# Patient Record
Sex: Female | Born: 1995 | Race: Black or African American | Hispanic: No | Marital: Single | Smoking: Never smoker
Health system: Southern US, Community
[De-identification: ages and names within clinical notes are randomized; demographics above are authoritative.]

## PROBLEM LIST (undated history)

## (undated) ENCOUNTER — Inpatient Hospital Stay (HOSPITAL_COMMUNITY): Payer: Self-pay

## (undated) DIAGNOSIS — Z8619 Personal history of other infectious and parasitic diseases: Secondary | ICD-10-CM

## (undated) DIAGNOSIS — A6009 Herpesviral infection of other urogenital tract: Secondary | ICD-10-CM

## (undated) HISTORY — DX: Personal history of other infectious and parasitic diseases: Z86.19

## (undated) HISTORY — PX: NO PAST SURGERIES: SHX2092

## (undated) HISTORY — DX: Herpesviral infection of other urogenital tract: A60.09

---
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
## 2016-02-01 ENCOUNTER — Ambulatory Visit: Payer: Medicaid Other | Admitting: Family Medicine

## 2016-03-13 NOTE — L&D Delivery Note (Signed)
Delivery Note:Dr. Parke SimmersBland, Dr. Rachelle HoraMoss At 6:08 AM a viable female was delivered via Vaginal, Spontaneous (Presentation:direct OA).  APGAR: 9, 9; weight  pending.   Placenta status:intact 3VC:  with no complications: .  Cord pH: n/a  Anesthesia: none   Episiotomy: None Lacerations: None Suture Repair: n/a Est. Blood Loss (mL): 100  Mom to postpartum.  Baby to Couplet care / Skin to Skin.  Marthenia RollingScott Shanesha Bednarz 02/19/2017, 6:20 AM

## 2016-03-16 ENCOUNTER — Telehealth: Payer: Self-pay | Admitting: Clinical

## 2016-03-16 NOTE — Telephone Encounter (Signed)
First attempt to contact patient, per patient's request, through  [facility]erral from [facility] Ernest HaberJasmine Williams, MSW, LCSW at [facility]; HIPPA-compliant message left on voicemail to return call to [place] at [facility] at [phone].

## 2016-04-04 ENCOUNTER — Ambulatory Visit: Payer: Medicaid Other | Admitting: Medical

## 2016-04-04 ENCOUNTER — Encounter: Payer: Self-pay | Admitting: Medical

## 2016-04-11 ENCOUNTER — Encounter (INDEPENDENT_AMBULATORY_CARE_PROVIDER_SITE_OTHER): Payer: Medicaid Other | Admitting: Medical

## 2016-04-11 NOTE — Progress Notes (Signed)
Pt here today for annual.  Pt is not due for annual due to pt being age of 21.  Advised pt that recommendations state that she does not need one until age of 89.   Denies any breast and vaginal issues.  Notified Raynelle FanningJulie, no new recommendations. Notified pt to f/u as needed for GYN related issues.  Pt stated understanding with no further questions.

## 2016-04-18 ENCOUNTER — Ambulatory Visit (INDEPENDENT_AMBULATORY_CARE_PROVIDER_SITE_OTHER): Payer: Medicaid Other | Admitting: Clinical

## 2016-04-18 DIAGNOSIS — F4323 Adjustment disorder with mixed anxiety and depressed mood: Secondary | ICD-10-CM

## 2016-04-18 NOTE — BH Specialist Note (Signed)
Session Start time: 3:40   End Time: 4:40 Total Time:  60 minutes Type of Service: Behavioral Health - Individual/Family Interpreter: No.   Interpreter Name & Language: n/a # [facility]June [facility]  SUBJECTIVE: Sara Lewis is a 21 y.o. female  Pt. was referred by Dr Alysia PennaErvin for:  depression. Pt. reports the following symptoms/concerns: Pt states her primary concerns are that her sleep is being constantly interrupted by mother-in-law calling and visiting, overeating, and worry over relationship problems with FOB. Pt says it helps to be able to talk about her feelings. Duration of problem:  Over two months Severity: moderately severe Previous treatment: none  OBJECTIVE: Mood: Anxious & Affect: Appropriate Risk of harm to self or others: No known risk of harm to self or others Assessments administered: PHQ9: 17/ GAD7: 18  LIFE CONTEXT:  Family & Social: Lives with FOB and 583 month old son; mother-in-law visits daily. 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  Pt may benefit from psychoeducation and brief therapeutic intervention regarding coping with symptoms of depression and anxiety.   PLAN: 1. F/U with behavioral health clinician: Two weeks 2. Behavioral Health meds: none, does not want at this time, will consider at next visit 3. Behavioral recommendations:  -Obtain journal today.  -Write in journal the hours others may not visit home, nor call, nor text. (Consider baby's morning nap time). Decide upon personal boundaries, and write them  down. -Have discussion with FOB and Mother-in-law about need to sleep uninterrupted during baby's nap time -Use sleep app during baby/mom nap time for additional self-coping, to improve sleep -Attend Mom Talk support group, at least one Tuesday in next two weeks, 10am, [facility] -Read educational material regarding coping with symptoms of depression and anxiety 4. Referral: Brief Counseling/Psychotherapy, Publishing rights managerCommunity Resource and Psychoeducation 5. From scale of 1-10, how likely are you to follow plan: 9  Woc-Behavioral Health Clinician  Behavioral Health Clinician  Warmhandoff: no  Depression screen [facility] 04/18/2016 12/15/2015 12/06/2015  Decreased Interest 2 1 1   Down, Depressed, Hopeless 2 1 1   PHQ - 2 Score 4 2 2   Altered sleeping 3 3 3   Tired, decreased energy 2 3 2   Change in appetite 3 2 2   Feeling bad or failure about yourself  3 2 2   Trouble concentrating 1 2 0  Moving slowly or fidgety/restless 1 1 1   Suicidal thoughts 0 0 0  PHQ-9 Score 17 15 12    GAD 7 : Generalized Anxiety Score 04/18/2016 12/15/2015 12/06/2015  Nervous, Anxious, on Edge 1 1 1   Control/stop worrying 3 2 1   Worry too much - different things 3 2 1   Trouble relaxing 3 2 2   Restless 2 2 1   Easily annoyed or irritable 3 3 3   Afraid - awful might happen 3 2 2   Total GAD 7 Score 18 14 11

## 2016-04-27 ENCOUNTER — Inpatient Hospital Stay (HOSPITAL_COMMUNITY): Payer: Medicaid Other

## 2016-04-27 ENCOUNTER — Inpatient Hospital Stay (HOSPITAL_COMMUNITY)
Admission: AD | Admit: 2016-04-27 | Discharge: 2016-04-27 | Disposition: A | Discharge status: Home / Self Care | Payer: Medicaid Other | Source: Ambulatory Visit | Attending: Emergency Medicine | Admitting: Emergency Medicine

## 2016-04-27 DIAGNOSIS — R079 Chest pain, unspecified: Secondary | ICD-10-CM

## 2016-04-27 DIAGNOSIS — R0781 Pleurodynia: Secondary | ICD-10-CM | POA: Diagnosis not present

## 2016-04-27 LAB — CBC WITH DIFFERENTIAL/PLATELET
BASOS ABS: 0 10*3/uL (ref 0.0–0.1)
BASOS PCT: 0 %
EOS ABS: 0.1 10*3/uL (ref 0.0–0.7)
EOS PCT: 1 %
HCT: 40.9 % (ref 36.0–46.0)
Hemoglobin: 13.8 g/dL (ref 12.0–15.0)
LYMPHS PCT: 37 %
Lymphs Abs: 3.4 10*3/uL (ref 0.7–4.0)
MCH: 28.3 pg (ref 26.0–34.0)
MCHC: 33.7 g/dL (ref 30.0–36.0)
MCV: 83.8 fL (ref 78.0–100.0)
MONO ABS: 0.6 10*3/uL (ref 0.1–1.0)
Monocytes Relative: 6 %
Neutro Abs: 5.1 10*3/uL (ref 1.7–7.7)
Neutrophils Relative %: 56 %
PLATELETS: 204 10*3/uL (ref 150–400)
RBC: 4.88 MIL/uL (ref 3.87–5.11)
RDW: 13.2 % (ref 11.5–15.5)
WBC: 9.2 10*3/uL (ref 4.0–10.5)

## 2016-04-27 LAB — COMPREHENSIVE METABOLIC PANEL
ALT: 24 U/L (ref 14–54)
AST: 24 U/L (ref 15–41)
Albumin: 4.1 g/dL (ref 3.5–5.0)
Alkaline Phosphatase: 88 U/L (ref 38–126)
Anion gap: 6 (ref 5–15)
BUN: 11 mg/dL (ref 6–20)
CO2: 29 mmol/L (ref 22–32)
Calcium: 10.4 mg/dL — ABNORMAL HIGH (ref 8.9–10.3)
Chloride: 101 mmol/L (ref 101–111)
Creatinine, Ser: 0.58 mg/dL (ref 0.44–1.00)
GFR calc Af Amer: >60 mL/min (ref >60)
Glucose, Bld: 101 mg/dL — ABNORMAL HIGH (ref 65–99)
Potassium: 4 mmol/L (ref 3.5–5.1)
SODIUM: 136 mmol/L (ref 135–145)
Total Bilirubin: 0.5 mg/dL (ref 0.3–1.2)
Total Protein: 7.2 g/dL (ref 6.5–8.1)

## 2016-04-27 LAB — URINALYSIS, ROUTINE W REFLEX MICROSCOPIC
Bilirubin Urine: NEGATIVE
GLUCOSE, UA: NEGATIVE mg/dL
HGB URINE DIPSTICK: NEGATIVE
Ketones, ur: NEGATIVE mg/dL
LEUKOCYTES UA: NEGATIVE
Nitrite: NEGATIVE
PH: 7 (ref 5.0–8.0)
Protein, ur: NEGATIVE mg/dL
Specific Gravity, Urine: 1.025 (ref 1.005–1.030)

## 2016-04-27 LAB — POCT PREGNANCY, URINE: PREG TEST UR: NEGATIVE

## 2016-04-27 LAB — PROTIME-INR
INR: 0.92
PROTHROMBIN TIME: 12.3 s (ref 11.4–15.2)

## 2016-04-27 LAB — APTT: APTT: 29 s (ref 24–36)

## 2016-04-27 LAB — D-DIMER, QUANTITATIVE (NOT AT ARMC): D DIMER QUANT: 0.33 ug{FEU}/mL (ref 0.00–0.50)

## 2016-04-27 MED ORDER — SODIUM CHLORIDE 0.9 % NICU IV INFUSION SIMPLE
INJECTION | INTRAVENOUS | Status: DC
  Administered 2016-04-27: 50 mL/h via INTRAVENOUS
  Filled 2016-04-27 (×2): qty 500

## 2016-04-27 MED ORDER — IOPAMIDOL (ISOVUE-370) INJECTION 76%
INTRAVENOUS | Status: AC
  Administered 2016-04-27: 100 mL
  Filled 2016-04-27: qty 100

## 2016-04-27 NOTE — ED Notes (Signed)
[facility] pain upon inhalation.

## 2016-04-27 NOTE — MAU Note (Signed)
Pt presents complaining of pain in her chest when she breathes in that started 4 hours ago. Also hurts in neck and head. States she took an ibuprofen but it did not help. LMP 04/11/16

## 2016-04-27 NOTE — Discharge Instructions (Signed)
1. Medications: usual home medications 2. Treatment: rest, drink plenty of fluids,  3. Follow Up: Please followup with your primary doctor in 2-3 days for discussion of your diagnoses and further evaluation after today's visit; if you do not have a primary care doctor use the resource guide provided to find one; Please return to the ER for fever, chills, shortness of breath, worsening chest pain, syncope or other concerns

## 2016-04-27 NOTE — MAU Provider Note (Signed)
History     CSN: [id number]  Arrival date and time: 04/27/16 0115   First Provider Initiated Contact with Patient 04/27/16 0130      Chief Complaint  Patient presents with  . Chest Pain   Chest Pain   This is a new problem. The current episode started yesterday (around 1900). The onset quality is sudden. The problem occurs intermittently (only with inhalation ). The problem has been unchanged. The pain is present in the lateral region. The pain is at a severity of 7/10. The quality of the pain is described as sharp. The pain does not radiate. Associated symptoms include headaches and vomiting (x1 since chest pain started. ). Pertinent negatives include no cough, fever or shortness of breath ("but I feel like I can't take a deep breath"). The pain is aggravated by breathing. She has tried NSAIDs for the symptoms. The treatment provided no relief. Risk factors include oral contraceptive use.  Pertinent negatives for past medical history include no aneurysm, no anxiety/panic attacks, no CHF, no DVT and no hypertension.  Her family medical history is significant for hypertension and stroke.   Past Medical History:  Diagnosis Date  . Herpes genitalis in women   . Hx of chlamydia infection 2017    Past Surgical History:  Procedure Laterality Date  . NO PAST SURGERIES      Family History  Problem Relation Age of Onset  . Hypertension Mother   . Hypertension Father   . Mental illness Father   . Cancer Maternal Grandmother     Social History  Substance Use Topics  . Smoking status: Never Smoker  . Smokeless tobacco: Never Used  . Alcohol use No    Allergies:  Allergies  Allergen Reactions  . Banana Itching    Prescriptions Prior to Admission  Medication Sig Dispense Refill Last Dose  . ibuprofen (ADVIL,MOTRIN) 600 MG tablet Take 1 tablet (600 mg total) by mouth every 6 (six) hours. 30 tablet 0   . norethindrone (CAMILA) 0.35 MG tablet Take 1 tablet (0.35 mg total) by  mouth daily. 1 Package 11   . Prenatal Vit-Fe Fumarate-FA (PRENATAL MULTIVITAMIN) TABS tablet Take 1 tablet by mouth at bedtime.   Past Month at Unknown time  . valACYclovir (VALTREX) 500 MG tablet Take 1 tablet (500 mg total) by mouth 2 (two) times daily. 60 tablet 6 12/24/2015 at Unknown time    Review of Systems  Constitutional: Negative for chills and fever.  Respiratory: Negative for cough and shortness of breath ("but I feel like I can't take a deep breath").   Cardiovascular: Positive for chest pain.  Gastrointestinal: Positive for vomiting (x1 since chest pain started. ).  Neurological: Positive for headaches.   Physical Exam   Blood pressure 135/89, pulse 103, temperature 98.3 F (36.8 C), resp. rate 18, last menstrual period 04/11/2016, SpO2 100 %, unknown if currently breastfeeding.  Physical Exam  Nursing note and vitals reviewed. Constitutional: She is oriented to person, place, and time. She appears well-developed and well-nourished. No distress.  HENT:  Head: Normocephalic.  Cardiovascular: Normal rate and normal heart sounds.   Respiratory: Effort normal and breath sounds normal. No respiratory distress.  GI: Soft. There is no tenderness. There is no rebound.  Neurological: She is alert and oriented to person, place, and time.  Skin: Skin is warm and dry.  Psychiatric: She has a normal mood and affect.    MAU Course  Procedures  MDM 0140: D/W Dr. Lynelle DoctorKnapp, accepts transfer  0211: [facility] on the until 0219: Patient off the unit with [facility] in stable condition.   Assessment and Plan   1. Chest pain, unspecified type    Transfer to [facility] for further evaluation   Tawnya Crook 04/27/2016, 1:30 AM

## 2016-04-27 NOTE — ED Provider Notes (Signed)
[facility]-EMERGENCY DEPT Provider Note   CSN: [id number] Arrival date & time: 04/27/16  0115     History   Chief Complaint Chief Complaint  Patient presents with  . Chest Pain    HPI Sara Lewis is a 21 y.o. female with no major medical problems presents to the Emergency Department complaining of gradual, persistent, progressively worsening left upper chest pain onset 7pm Wednesday. Pain is worse with exertion and breathing.  Patient reports that around midnight the left-sided chest pain radiated into her neck and head. She had no vision changes, weakness, numbness or tingling. Patient is taking an oral contraceptive.  Pt is also breastfeeding (4mos post partum).  Pt denies nausea, vomiting, diaphoresis, syncope, near syncope.  She reports she's had complete resolution of all of her symptoms within the last hour.  She initially presented to MAU and was transferred here to the emergency department via [facility].    The history is provided by the patient, medical records and the spouse. No language interpreter was used.    Past Medical History:  Diagnosis Date  . Herpes genitalis in women   . Hx of chlamydia infection 2017    Patient Active Problem List   Diagnosis Date Noted  . 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 History of marijuana use 11/29/2015  . Low serum vitamin D 11/29/2015    Past Surgical History:  Procedure Laterality Date  . NO PAST SURGERIES      OB History    Gravida Para Term Preterm AB Living   1 1 1     1    SAB TAB Ectopic Multiple Live Births         0 1       Home Medications    Prior to Admission medications   Medication Sig Start Date End Date Taking? Authorizing Provider  norethindrone (CAMILA) 0.35 MG tablet Take 1 tablet (0.35 mg total) by mouth daily. 12/27/15  Yes Montez Morita, CNM  valACYclovir (VALTREX) 500 MG tablet Take 1 tablet (500 mg total) by mouth 2 (two) times daily. 12/06/15   Judeth Horn, NP    Family  History Family History  Problem Relation Age of Onset  . Hypertension Mother   . Hypertension Father   . Mental illness Father   . Cancer Maternal Grandmother     Social History Social History  Substance Use Topics  . Smoking status: Never Smoker  . Smokeless tobacco: Never Used  . Alcohol use No     Allergies   Banana   Review of Systems Review of Systems  Cardiovascular: Positive for chest pain.       DOE  Neurological: Positive for headaches.  All other systems reviewed and are negative.    Physical Exam Updated Vital Signs BP 117/77   Pulse 88   Temp 98.3 F (36.8 C)   Resp 18   LMP 04/11/2016 (Exact Date)   SpO2 100%   Physical Exam  Constitutional: She appears well-developed and well-nourished. No distress.  Awake, alert, nontoxic appearance  HENT:  Head: Normocephalic and atraumatic.  Mouth/Throat: Oropharynx is clear and moist. No oropharyngeal exudate.  Eyes: Conjunctivae are normal. No scleral icterus.  Neck: Normal range of motion. Neck supple.  Cardiovascular: Normal rate, regular rhythm and intact distal pulses.   Pulmonary/Chest: Effort normal and breath sounds normal. No respiratory distress. She has no wheezes.  Equal chest expansion  Abdominal: Soft. Bowel sounds are normal. She exhibits no mass. There is no tenderness.  There is no rebound and no guarding.  Musculoskeletal: Normal range of motion. She exhibits no edema.  Neurological: She is alert.  Speech is clear and goal oriented Moves extremities without ataxia  Skin: Skin is warm and dry. She is not diaphoretic.  Psychiatric: She has a normal mood and affect.  Nursing note and vitals reviewed.    ED Treatments / Results  Labs (all labs ordered are listed, but only abnormal results are displayed) Labs Reviewed  COMPREHENSIVE METABOLIC PANEL - Abnormal; Notable for the following:       Result Value   Glucose, Bld 101 (*)    Calcium 10.4 (*)    All other components within  normal limits  URINALYSIS, ROUTINE W REFLEX MICROSCOPIC  CBC WITH DIFFERENTIAL/PLATELET  D-DIMER, QUANTITATIVE (NOT AT [facility])  PROTIME-INR  APTT  POCT PREGNANCY, URINE   ED ECG REPORT   Date: 04/27/2016  Rate: 89  Rhythm: normal sinus rhythm  QRS Axis: normal  Intervals: normal  ST/T Wave abnormalities: normal  Conduction Disutrbances:none  Narrative Interpretation:   Old EKG Reviewed: none available  I have personally reviewed the EKG tracing and agree with the computerized printout as noted.   Radiology Ct Angio Chest Pe W Or Wo Contrast  Result Date: 04/27/2016 CLINICAL DATA:  Chest pain on breathing started 4 hours ago. Tachycardia and shortness of breath. EXAM: CT ANGIOGRAPHY CHEST WITH CONTRAST TECHNIQUE: Multidetector CT imaging of the chest was performed using the standard protocol during bolus administration of intravenous contrast. Multiplanar CT image reconstructions and MIPs were obtained to evaluate the vascular anatomy. CONTRAST:  100 mL Isovue 370 COMPARISON:  None. FINDINGS: Cardiovascular: Good opacification of the central and segmental pulmonary arteries. No focal filling defects. No evidence of significant pulmonary embolus. Normal heart size. No pericardial effusion. Normal caliber thoracic aorta. No aortic dissection. Great vessel origins are patent. Mediastinum/Nodes: No enlarged mediastinal, hilar, or axillary lymph nodes. Thyroid gland, trachea, and esophagus demonstrate no significant findings. Lungs/Pleura: Lungs are clear. No pleural effusion or pneumothorax. Upper Abdomen: No acute abnormality. Musculoskeletal: No chest wall abnormality. No acute or significant osseous findings. Review of the MIP images confirms the above findings. IMPRESSION: No evidence of significant pulmonary embolus. No evidence of active pulmonary disease. Electronically Signed   By: Burman Nieves M.D.   On: 04/27/2016 05:14    Procedures Procedures (including critical care  time)  Medications Ordered in ED Medications  sodium chloride 0.9 % IV infusion ( Intravenous Stopped 04/27/16 0538)  iopamidol (ISOVUE-370) 76 % injection (100 mLs  Contrast Given 04/27/16 0450)     Initial Impression / Assessment and Plan / ED Course  I have reviewed the triage vital signs and the nursing notes.  Pertinent labs & imaging results that were available during my care of the patient were reviewed by me and considered in my medical decision making (see chart for details).     Presents with pleuritic chest pain, worse with exertion. Labs are reassuring. Troponin is negative. Heart rate around 98. Her d-dimer is negative however patient has not low risk. CT angiogram chest is without evidence of PE. Patient is asymptomatic at this time. She is well appearing, speaking in full sentences and ambulatory without difficulty. Stable for discharge home at this time. She is to return for worsening symptoms, return of symptoms or other concerns.  Final Clinical Impressions(s) / ED Diagnoses   Final diagnoses:  Chest pain, unspecified type  Pleuritic chest pain    New Prescriptions Discharge Medication  List as of 04/27/2016  5:24 AM       Dierdre ForthHannah Hardy Harcum, PA-C 04/27/16 [id number]    Gilda Creasehristopher J Pollina, MD 04/27/16 [phone]

## 2016-12-04 ENCOUNTER — Ambulatory Visit (INDEPENDENT_AMBULATORY_CARE_PROVIDER_SITE_OTHER): Payer: Self-pay | Admitting: General Practice

## 2016-12-04 ENCOUNTER — Encounter: Payer: Self-pay | Admitting: Family Medicine

## 2016-12-04 ENCOUNTER — Ambulatory Visit: Payer: Self-pay

## 2016-12-04 DIAGNOSIS — O09893 Supervision of other high risk pregnancies, third trimester: Secondary | ICD-10-CM

## 2016-12-04 DIAGNOSIS — Z3687 Encounter for antenatal screening for uncertain dates: Secondary | ICD-10-CM

## 2016-12-04 DIAGNOSIS — Z3201 Encounter for pregnancy test, result positive: Secondary | ICD-10-CM | POA: Diagnosis not present

## 2016-12-04 DIAGNOSIS — O3680X0 Pregnancy with inconclusive fetal viability, not applicable or unspecified: Secondary | ICD-10-CM

## 2016-12-04 DIAGNOSIS — O0933 Supervision of pregnancy with insufficient antenatal care, third trimester: Secondary | ICD-10-CM

## 2016-12-04 NOTE — Progress Notes (Signed)
Patient here for upt today. UPT +. Patient reports first positive home test a month ago. Patient states she has been on OCPs since her baby was born a year ago. Patient states she has stopped OCPs since positive test at home. Patient reports taking OCPs every day without missing a dose. Patient states 3 months ago her periods changed to spotting for 3 days. Patient reports morning sickness starting a month ago. States a couple weeks ago she thought she started to feel a baby move. Patient denies taking medication, just PNV.  Diane Day will scan patient for viability/dating.

## 2016-12-04 NOTE — Progress Notes (Signed)
Pt informed that the ultrasound is considered a limited OB ultrasound and is not intended to be a complete ultrasound exam.  Patient also informed that the ultrasound is not being completed with the intent of assessing for fetal or placental anomalies or any pelvic abnormalities.  Explained that the purpose of today's ultrasound is to assess for viability, estimated gestational age.  Patient acknowledges the purpose of the exam and the limitations of the study.    Pt provided more detailed menstrual history to me and states she missed her OCP's on 2/17 and 2/18 due to being out of town. She resumed taking the pills on 05/01/16. She had periods beginning 04/17/16 and 05/15/16 which were normal. She then had a period which began 06/12/16 however that period was not normal. She stopped taking the OCP's once she had a positive home pregnancy test about a month ago. [place] for anatomy scheduled 2/26 @ 1445. New Ob appt w/2hr GTT will be scheduled ASAP. Pt advised that her due date is subject to change based on findings of detailed [place] scheduled on 12/06/16. She voiced understanding.

## 2016-12-04 NOTE — Progress Notes (Signed)
Patient seen and assessed by nursing staff.  Agree with documentation and plan.  

## 2016-12-05 ENCOUNTER — Encounter (HOSPITAL_COMMUNITY): Payer: Self-pay | Admitting: *Deleted

## 2016-12-06 ENCOUNTER — Ambulatory Visit (HOSPITAL_COMMUNITY)
Admission: RE | Admit: 2016-12-06 | Discharge: 2016-12-06 | Disposition: A | Discharge status: Home / Self Care | Payer: Medicaid Other | Source: Ambulatory Visit | Attending: Family Medicine | Admitting: Family Medicine

## 2016-12-06 ENCOUNTER — Other Ambulatory Visit: Payer: Self-pay | Admitting: Family Medicine

## 2016-12-06 ENCOUNTER — Encounter (HOSPITAL_COMMUNITY): Payer: Self-pay

## 2016-12-06 DIAGNOSIS — Z3689 Encounter for other specified antenatal screening: Secondary | ICD-10-CM

## 2016-12-06 DIAGNOSIS — Z3687 Encounter for antenatal screening for uncertain dates: Secondary | ICD-10-CM

## 2016-12-06 DIAGNOSIS — O0933 Supervision of pregnancy with insufficient antenatal care, third trimester: Secondary | ICD-10-CM | POA: Insufficient documentation

## 2016-12-06 DIAGNOSIS — O09893 Supervision of other high risk pregnancies, third trimester: Secondary | ICD-10-CM

## 2016-12-06 DIAGNOSIS — Z3A29 29 weeks gestation of pregnancy: Secondary | ICD-10-CM | POA: Diagnosis not present

## 2016-12-06 DIAGNOSIS — Z3201 Encounter for pregnancy test, result positive: Secondary | ICD-10-CM

## 2016-12-07 ENCOUNTER — Other Ambulatory Visit: Payer: Self-pay

## 2016-12-07 ENCOUNTER — Other Ambulatory Visit (HOSPITAL_COMMUNITY): Payer: Self-pay | Admitting: *Deleted

## 2016-12-21 ENCOUNTER — Other Ambulatory Visit (HOSPITAL_COMMUNITY): Payer: Self-pay | Admitting: *Deleted

## 2016-12-21 ENCOUNTER — Encounter: Payer: Self-pay | Admitting: Student

## 2016-12-21 DIAGNOSIS — IMO0002 Reserved for concepts with insufficient information to code with codable children: Secondary | ICD-10-CM

## 2016-12-21 DIAGNOSIS — Z0489 Encounter for examination and observation for other specified reasons: Secondary | ICD-10-CM

## 2017-01-03 ENCOUNTER — Ambulatory Visit (HOSPITAL_COMMUNITY)
Admission: RE | Admit: 2017-01-03 | Discharge: 2017-01-03 | Disposition: A | Discharge status: Home / Self Care | Payer: Medicaid Other | Source: Ambulatory Visit | Attending: Family Medicine | Admitting: Family Medicine

## 2017-01-03 DIAGNOSIS — Z0489 Encounter for examination and observation for other specified reasons: Secondary | ICD-10-CM

## 2017-01-03 DIAGNOSIS — Z362 Encounter for other antenatal screening follow-up: Secondary | ICD-10-CM | POA: Diagnosis present

## 2017-01-03 DIAGNOSIS — IMO0002 Reserved for concepts with insufficient information to code with codable children: Secondary | ICD-10-CM

## 2017-02-19 ENCOUNTER — Inpatient Hospital Stay (HOSPITAL_COMMUNITY)
Admission: AD | Admit: 2017-02-19 | Discharge: 2017-02-20 | DRG: 807 | Disposition: A | Discharge status: Home / Self Care | Payer: Medicaid Other | Source: Ambulatory Visit | Attending: Family Medicine | Admitting: Family Medicine

## 2017-02-19 ENCOUNTER — Other Ambulatory Visit: Payer: Self-pay

## 2017-02-19 ENCOUNTER — Encounter (HOSPITAL_COMMUNITY): Payer: Self-pay

## 2017-02-19 DIAGNOSIS — Z3483 Encounter for supervision of other normal pregnancy, third trimester: Secondary | ICD-10-CM | POA: Diagnosis present

## 2017-02-19 DIAGNOSIS — A6000 Herpesviral infection of urogenital system, unspecified: Secondary | ICD-10-CM | POA: Diagnosis present

## 2017-02-19 DIAGNOSIS — O9832 Other infections with a predominantly sexual mode of transmission complicating childbirth: Principal | ICD-10-CM | POA: Diagnosis present

## 2017-02-19 DIAGNOSIS — B009 Herpesviral infection, unspecified: Secondary | ICD-10-CM

## 2017-02-19 DIAGNOSIS — Z3A39 39 weeks gestation of pregnancy: Secondary | ICD-10-CM

## 2017-02-19 LAB — TYPE AND SCREEN
ABO/RH(D): O NEG
Antibody Screen: NEGATIVE

## 2017-02-19 LAB — RAPID HIV SCREEN (HIV 1/2 AB+AG)
HIV 1/2 Antibodies: NONREACTIVE
HIV-1 P24 ANTIGEN - HIV24: NONREACTIVE

## 2017-02-19 LAB — CBC
HEMATOCRIT: 37.2 % (ref 36.0–46.0)
HEMOGLOBIN: 12.2 g/dL (ref 12.0–15.0)
MCH: 28.4 pg (ref 26.0–34.0)
MCHC: 32.8 g/dL (ref 30.0–36.0)
MCV: 86.7 fL (ref 78.0–100.0)
Platelets: 181 10*3/uL (ref 150–400)
RBC: 4.29 MIL/uL (ref 3.87–5.11)
RDW: 13.6 % (ref 11.5–15.5)
WBC: 10 10*3/uL (ref 4.0–10.5)

## 2017-02-19 LAB — RAPID URINE DRUG SCREEN, HOSP PERFORMED
AMPHETAMINES: NOT DETECTED
BARBITURATES: NOT DETECTED
BENZODIAZEPINES: NOT DETECTED
Cocaine: NOT DETECTED
Opiates: NOT DETECTED
TETRAHYDROCANNABINOL: POSITIVE — AB

## 2017-02-19 LAB — HEPATITIS B SURFACE ANTIGEN: HEP B S AG: NEGATIVE

## 2017-02-19 LAB — RPR: RPR Ser Ql: NONREACTIVE

## 2017-02-19 LAB — GROUP B STREP BY PCR: GROUP B STREP BY PCR: NEGATIVE

## 2017-02-19 MED ORDER — FLEET ENEMA 7-19 GM/118ML RE ENEM: 1.0000 | ENEMA | RECTAL | Status: DC | PRN

## 2017-02-19 MED ORDER — ACETAMINOPHEN 325 MG PO TABS: 650.0000 mg | ORAL_TABLET | ORAL | Status: DC | PRN

## 2017-02-19 MED ORDER — SENNOSIDES-DOCUSATE SODIUM 8.6-50 MG PO TABS
2.0000 | ORAL_TABLET | ORAL | Status: DC
  Administered 2017-02-20: 2 via ORAL
  Filled 2017-02-19: qty 2

## 2017-02-19 MED ORDER — SOD CITRATE-CITRIC ACID 500-334 MG/5ML PO SOLN: 30.0000 mL | ORAL | Status: DC | PRN

## 2017-02-19 MED ORDER — OXYTOCIN 40 UNITS IN LACTATED RINGERS INFUSION - SIMPLE MED: 2.5000 [IU]/h | INTRAVENOUS | Status: DC

## 2017-02-19 MED ORDER — IBUPROFEN 600 MG PO TABS
600.0000 mg | ORAL_TABLET | Freq: Four times a day (QID) | ORAL | Status: DC
  Administered 2017-02-19 – 2017-02-20 (×6): 600 mg via ORAL
  Filled 2017-02-19 (×6): qty 1

## 2017-02-19 MED ORDER — LACTATED RINGERS IV SOLN: INTRAVENOUS | Status: DC

## 2017-02-19 MED ORDER — LACTATED RINGERS IV SOLN: 500.0000 mL | INTRAVENOUS | Status: DC | PRN

## 2017-02-19 MED ORDER — ZOLPIDEM TARTRATE 5 MG PO TABS: 5.0000 mg | ORAL_TABLET | Freq: Every evening | ORAL | Status: DC | PRN

## 2017-02-19 MED ORDER — OXYCODONE-ACETAMINOPHEN 5-325 MG PO TABS: 2.0000 | ORAL_TABLET | ORAL | Status: DC | PRN

## 2017-02-19 MED ORDER — LIDOCAINE HCL (PF) 1 % IJ SOLN
30.0000 mL | INTRAMUSCULAR | Status: DC | PRN
  Filled 2017-02-19: qty 30

## 2017-02-19 MED ORDER — DIPHENHYDRAMINE HCL 25 MG PO CAPS: 25.0000 mg | ORAL_CAPSULE | Freq: Four times a day (QID) | ORAL | Status: DC | PRN

## 2017-02-19 MED ORDER — ONDANSETRON HCL 4 MG/2ML IJ SOLN: 4.0000 mg | INTRAMUSCULAR | Status: DC | PRN

## 2017-02-19 MED ORDER — PRENATAL MULTIVITAMIN CH
1.0000 | ORAL_TABLET | Freq: Every day | ORAL | Status: DC
  Administered 2017-02-19 – 2017-02-20 (×2): 1 via ORAL
  Filled 2017-02-19 (×2): qty 1

## 2017-02-19 MED ORDER — TETANUS-DIPHTH-ACELL PERTUSSIS 5-2.5-18.5 LF-MCG/0.5 IM SUSP: 0.5000 mL | Freq: Once | INTRAMUSCULAR | Status: DC

## 2017-02-19 MED ORDER — COCONUT OIL OIL: 1.0000 "application " | TOPICAL_OIL | Status: DC | PRN

## 2017-02-19 MED ORDER — WITCH HAZEL-GLYCERIN EX PADS: 1.0000 "application " | MEDICATED_PAD | CUTANEOUS | Status: DC | PRN

## 2017-02-19 MED ORDER — SIMETHICONE 80 MG PO CHEW: 80.0000 mg | CHEWABLE_TABLET | ORAL | Status: DC | PRN

## 2017-02-19 MED ORDER — OXYCODONE-ACETAMINOPHEN 5-325 MG PO TABS: 1.0000 | ORAL_TABLET | ORAL | Status: DC | PRN

## 2017-02-19 MED ORDER — FENTANYL CITRATE (PF) 100 MCG/2ML IJ SOLN
100.0000 ug | Freq: Once | INTRAMUSCULAR | Status: AC
  Administered 2017-02-19: 100 ug via INTRAVENOUS
  Filled 2017-02-19: qty 2

## 2017-02-19 MED ORDER — OXYTOCIN 10 UNIT/ML IJ SOLN
INTRAMUSCULAR | Status: AC
  Filled 2017-02-19: qty 2

## 2017-02-19 MED ORDER — DIBUCAINE 1 % RE OINT: 1.0000 "application " | TOPICAL_OINTMENT | RECTAL | Status: DC | PRN

## 2017-02-19 MED ORDER — BENZOCAINE-MENTHOL 20-0.5 % EX AERO
1.0000 "application " | INHALATION_SPRAY | CUTANEOUS | Status: DC | PRN
  Administered 2017-02-19: 1 via TOPICAL
  Filled 2017-02-19: qty 56

## 2017-02-19 MED ORDER — ONDANSETRON HCL 4 MG/2ML IJ SOLN: 4.0000 mg | Freq: Four times a day (QID) | INTRAMUSCULAR | Status: DC | PRN

## 2017-02-19 MED ORDER — ONDANSETRON HCL 4 MG PO TABS: 4.0000 mg | ORAL_TABLET | ORAL | Status: DC | PRN

## 2017-02-19 MED ORDER — OXYTOCIN 10 UNIT/ML IJ SOLN
INTRAMUSCULAR | Status: AC
  Administered 2017-02-19: 10 [IU]
  Filled 2017-02-19: qty 1

## 2017-02-19 MED ORDER — OXYTOCIN BOLUS FROM INFUSION: 500.0000 mL | Freq: Once | INTRAVENOUS | Status: DC

## 2017-02-19 MED ORDER — LIDOCAINE HCL (PF) 1 % IJ SOLN
INTRAMUSCULAR | Status: AC
  Filled 2017-02-19: qty 30

## 2017-02-19 MED ORDER — ACETAMINOPHEN 325 MG PO TABS
650.0000 mg | ORAL_TABLET | ORAL | Status: DC | PRN
  Administered 2017-02-19: 650 mg via ORAL
  Filled 2017-02-19: qty 2

## 2017-02-19 NOTE — H&P (Signed)
LABOR AND DELIVERY ADMISSION HISTORY AND PHYSICAL NOTE  Sara Lewis is a 21 y.o. female G2P1001 with IUP at 8061w5d by 29wkUS presenting for SOL.  She reports positive fetal movement. She denies leakage of fluid or vaginal bleeding.  [facility], unaware pregnant until >196months Pregnancy complications:  - Hx of herpes lesion during pregnancy, unknown GBS  Past Medical History: Past Medical History:  Diagnosis Date  . Herpes genitalis in women   . Hx of chlamydia infection 2017    Past Surgical History: Past Surgical History:  Procedure Laterality Date  . NO PAST SURGERIES      Obstetrical History: OB History    Gravida Para Term Preterm AB Living   2 1 1     1    SAB TAB Ectopic Multiple Live Births         0 1      Social History: Social History   Socioeconomic History  . Marital status: Single    Spouse name: None  . Number of children: None  . Years of education: None  . Highest education level: None  Social Needs  . Financial resource strain: None  . Food insecurity - worry: None  . Food insecurity - inability: None  . Transportation needs - medical: None  . Transportation needs - non-medical: None  Occupational History  . None  Tobacco Use  . Smoking status: Never Smoker  . Smokeless tobacco: Never Used  Substance and Sexual Activity  . Alcohol use: No  . Drug use: No    Comment: + UDS at beginning of pregnancy  . Sexual activity: Yes    Birth control/protection: None  Other Topics Concern  . None  Social History Narrative  . None    Family History: Family History  Problem Relation Age of Onset  . Hypertension Mother   . Hypertension Father   . Mental illness Father   . Cancer Maternal Grandmother     Allergies: Allergies  Allergen Reactions  . Banana Itching    Medications Prior to Admission  Medication Sig Dispense Refill Last Dose  . norethindrone (CAMILA) 0.35 MG tablet  Take 1 tablet (0.35 mg total) by mouth daily. (Patient not taking: Reported on 12/06/2016) 1 Package 11 Not Taking  . Prenatal Vit-Fe Fumarate-FA (PRENATAL VITAMIN PO) Take by mouth.   Taking  . valACYclovir (VALTREX) 500 MG tablet Take 1 tablet (500 mg total) by mouth 2 (two) times daily. 60 tablet 6 More than a month at Unknown time     Review of Systems  All systems reviewed and negative except as stated in HPI  Physical Exam- Blood pressure 131/81, pulse 86, resp. rate 19, last menstrual period 06/12/2016, SpO2 98 %, unknown if currently breastfeeding. General appearance: alert, cooperative, appears stated age and no distress Lungs: clear to auscultation bilaterally Heart: regular rate and rhythm Abdomen: soft, non-tender; bowel sounds normal Extremities: No calf swelling or tenderness Presentation: cephalicOA Fetal monitoring: cat 1 via toco Uterine activity: contration regularly 2-4 min on admission Dilation: 8 Effacement (%): 90 Station: -1 Exam by:: Camelia Enganielle Simpson RN  Prenatal labs: ABO, Rh:   O neg Antibody:   neg Rubella:   unknown RPR:   pending HBsAg:   unknown HIV:   unknown GC/Chlamydia: unknown GBS:   pending 1 hr Glucola: unknown Genetic screening:  Not on file Anatomy US: normal  Prenatal Transfer Tool  Maternal Diabetes: No per mom Genetic Screening: not on file Maternal Ultrasounds/Referrals:  Normal Fetal Ultrasounds or other Referrals:  None Maternal Substance Abuse:  Yes:  Type: Marijuana in past, current UDS pending Significant Maternal Medications:  Meds include: Other: valtrex, no lesions currently Significant Maternal Lab Results: None  Results for orders placed or performed during the hospital encounter of 02/19/17 (from the past 24 hour(s))  CBC   Collection Time: 02/19/17  5:22 AM  Result Value Ref Range   WBC 10.0 4.0 - 10.5 K/uL   RBC 4.29 3.87 - 5.11 MIL/uL   Hemoglobin 12.2 12.0 - 15.0 g/dL   HCT 16.137.2 09.636.0 - 04.546.0 %   MCV 86.7 78.0  - 100.0 fL   MCH 28.4 26.0 - 34.0 pg   MCHC 32.8 30.0 - 36.0 g/dL   RDW 40.913.6 81.111.5 - 91.415.5 %   Platelets 181 150 - 400 K/uL    Patient Active Problem List   Diagnosis Date Noted  . 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 History of marijuana use 11/29/2015  . Low serum vitamin D 11/29/2015    Assessment: Sara Lewis is a 21 y.o. G2P1001 at 3844w5d here for SOL for svd  #Labor: expectent managment #Pain: IV fentanyl #FWB: Cat 1 #ID:  GBS unknown #MOF: breast #MOC:depo #Circ:  Yes, outpatient  Marthenia RollingScott Levada Bowersox 02/19/2017, 5:58 AM

## 2017-02-19 NOTE — MAU Note (Signed)
Pt states water broke at 1900-clear fluid. Reports contractions every 2-3 mins since 1 am. Denies vaginal bleeding. Reports good fetal movement.

## 2017-02-20 ENCOUNTER — Ambulatory Visit: Payer: Self-pay

## 2017-02-20 LAB — RUBELLA SCREEN: Rubella: 1.12 index (ref >0.99)

## 2017-02-20 MED ORDER — VALACYCLOVIR HCL 500 MG PO TABS: 500.0000 mg | ORAL_TABLET | Freq: Two times a day (BID) | ORAL | 6 refills | Status: AC

## 2017-02-20 MED ORDER — MEDROXYPROGESTERONE ACETATE 150 MG/ML IM SUSP
150.0000 mg | Freq: Once | INTRAMUSCULAR | Status: AC
  Administered 2017-02-20: 150 mg via INTRAMUSCULAR
  Filled 2017-02-20: qty 1

## 2017-02-20 MED ORDER — VALACYCLOVIR HCL 1 G PO TABS: 2000.0000 mg | ORAL_TABLET | Freq: Two times a day (BID) | ORAL | 0 refills | Status: AC

## 2017-02-20 MED ORDER — RHO D IMMUNE GLOBULIN 1500 UNIT/2ML IJ SOSY
300.0000 ug | PREFILLED_SYRINGE | Freq: Once | INTRAMUSCULAR | Status: AC
  Administered 2017-02-20: 300 ug via INTRAMUSCULAR
  Filled 2017-02-20: qty 2

## 2017-02-20 NOTE — Lactation Note (Signed)
This note was copied from a baby's chart. Lactation Consultation Note  Patient Name: Sara Lewis Today's Date: 02/20/2017 Reason for consult: Initial assessment   P2, Ex BF 9 months. Mother denies questions or problems.  Encouraged depth and applying ebm for soreness.  Ask for coconut oil if needed. Noted case of formula in the room.  Although baby has not had formula, mother states she will do both. Mom encouraged to feed baby 8-12 times/24 hours and with feeding cues.  Breastfeed first before formula. Mom made aware of O/P services, breastfeeding support groups, community resources, and our phone # for post-discharge questions.     Maternal Data Has patient been taught Hand Expression?: Yes Does the patient have breastfeeding experience prior to this delivery?: Yes  Feeding Feeding Type: Breast Fed Length of feed: 20 min  LATCH Score Latch: Grasps breast easily, tongue down, lips flanged, rhythmical sucking.  Audible Swallowing: Spontaneous and intermittent  Type of Nipple: Everted at rest and after stimulation  Comfort (Breast/Nipple): Soft / non-tender  Hold (Positioning): Assistance needed to correctly position infant at breast and maintain latch.  LATCH Score: 9  Interventions    Lactation Tools Discussed/Used     Consult Status Consult Status: Follow-up Date: 02/21/17 Follow-up type: In-patient    Dahlia ByesBerkelhammer, [facility] 02/20/2017, 7:04 PM
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
## 2017-02-20 NOTE — Discharge Summary (Signed)
OB Discharge Summary     Patient Name: Sara Lewis DOB: [birth year] MRN: [id number]  Date of admission: 02/19/2017 Delivering MD: Rolm BookbinderMOSS, AMBER   Date of discharge: 02/20/2017  Admitting diagnosis: 40wks labor check Intrauterine pregnancy: 2520w5d     Secondary diagnosis:  Active Problems:   SVD (spontaneous vaginal delivery)   Labor abnormal   Normal labor  Additional problems: h/o herpes, limited PNC.      Discharge diagnosis: Term Pregnancy Delivered                                                                                                Post partum procedures:rhogam  Augmentation: NA  Complications: None  Hospital course:  Onset of Labor With Vaginal Delivery     21 y.o. yo [id number] at 820w5d was admitted in Active Labor on 02/19/2017. Patient had an uncomplicated labor course as follows:  Membrane Rupture Time/Date: 6:05 AM ,02/19/2017   Intrapartum Procedures: Episiotomy: None [1]                                         Lacerations:  None [1]  Patient had a delivery of a Viable infant. 02/19/2017  Information for the patient's newborn:  [place]ford [place], Boy Betzaira [[id number]][[id number]]  Delivery Method: Vaginal, Spontaneous(Filed from Delivery Summary)    Pateint had an uncomplicated postpartum course.  She is ambulating, tolerating a regular diet, passing flatus, and urinating well. Patient is discharged home in stable condition on 02/20/17.   Physical exam  Vitals:   02/19/17 0902 02/19/17 1258 02/19/17 1754 02/20/17 0552  BP: 127/75 113/77 115/72 110/67  Pulse: 71 79 72 70  Resp: 18 19 18 14   Temp:   (!) 97.4 F (36.3 C) 98.3 F (36.8 C)  TempSrc:   Oral Oral  SpO2: 99%   99%  Weight:      Height:       General: alert, cooperative and no distress Lochia: appropriate Uterine Fundus: firm Incision: N/A DVT Evaluation: No evidence of DVT seen on physical exam. Labs: Lab Results  Component Value Date   WBC 10.0 02/19/2017   HGB 12.2 02/19/2017   HCT 37.2 02/19/2017   MCV 86.7 02/19/2017   PLT 181 02/19/2017   CMP Latest Ref Rng & Units 04/27/2016  Glucose 65 - 99 mg/dL 283(T101(H)  BUN 6 - 20 mg/dL 11  Creatinine 5.170.44 - 6.161.00 mg/dL 0.730.58  Sodium 710135 - 626145 mmol/L 136  Potassium 3.5 - 5.1 mmol/L 4.0  Chloride 101 - 111 mmol/L 101  CO2 22 - 32 mmol/L 29  Calcium 8.9 - 10.3 mg/dL 10.4(H)  Total Protein 6.5 - 8.1 g/dL 7.2  Total Bilirubin 0.3 - 1.2 mg/dL 0.5  Alkaline Phos 38 - 126 U/L 88  AST 15 - 41 U/L 24  ALT 14 - 54 U/L 24    Discharge instruction: per After Visit Summary and "Baby and Me Booklet".  After visit meds:  Allergies as of 02/20/2017      Reactions  Banana Itching      Medication List    TAKE these medications   PRENATAL VITAMIN PO Take by mouth.   valACYclovir 1000 MG tablet Commonly known as:  VALTREX Take 2 tablets (2,000 mg total) by mouth 2 (two) times daily for 2 days. What changed:  You were already taking a medication with the same name, and this prescription was added. Make sure you understand how and when to take each.   valACYclovir 500 MG tablet Commonly known as:  VALTREX Take 1 tablet (500 mg total) by mouth 2 (two) times daily. What changed:  Another medication with the same name was added. Make sure you understand how and when to take each.       Diet: routine diet  Activity: Advance as tolerated. Pelvic rest for 6 weeks.   Outpatient follow up:6 weeks Follow up Appt:No future appointments. Follow up Visit:No Follow-up on file.  Postpartum contraception: Depo Provera  Newborn Data: Live born female  Birth Weight: 6 lb 13 oz (3090 g) APGAR: 9, 9  Newborn Delivery   Birth date/time:  02/19/2017 06:08:00 Delivery type:  Vaginal, Spontaneous     Baby Feeding: Breast Disposition:home with mother   02/20/2017 Marylene LandKathryn Lorraine Kooistra, CNM

## 2017-02-21 ENCOUNTER — Ambulatory Visit: Payer: Self-pay

## 2017-02-21 LAB — RH IG WORKUP (INCLUDES ABO/RH)
ABO/RH(D): O NEG
FETAL SCREEN: NEGATIVE
Gestational Age(Wks): 39.5
UNIT DIVISION: 0

## 2017-02-21 NOTE — Lactation Note (Signed)
This note was copied from a baby's chart. Lactation Consultation Note  Patient Name: Rayford HalstedBoy Delano Proffit FAOZH'YToday's Date: 02/21/2017 Reason for consult: Follow-up assessment  Follow up visit at 53 hours of age.  Mom reports good feedings and denies pain with latching.  Mom is experienced with older child nursing for 6 months.  Mom reports remembering milk volume increasing and need for more frequent feedings.   Discussed milk transitioning to larger volume, engorgement care discussed.  Encouraged frequent feedings. Mom to soften breast as needed prior to latch.   Mom mentioned baby favors right breast more than left.  Lc encouraged mom to keep trying and explained as somewhat normal.  Mom aware of o/p services as needed.   Mom awaiting discharge now.      Maternal Data    Feeding Length of feed: 5 min  LATCH Score Latch: Grasps breast easily, tongue down, lips flanged, rhythmical sucking.  Audible Swallowing: Spontaneous and intermittent  Type of Nipple: Everted at rest and after stimulation  Comfort (Breast/Nipple): Soft / non-tender  Hold (Positioning): No assistance needed to correctly position infant at breast.  LATCH Score: 10  Interventions Interventions: Breast feeding basics reviewed  Lactation Tools Discussed/Used     Consult Status Consult Status: Complete    Franz DellJana Shoptaw 02/21/2017, 11:22 AM

## 2017-03-05 ENCOUNTER — Inpatient Hospital Stay (HOSPITAL_COMMUNITY)
Admission: AD | Admit: 2017-03-05 | Discharge: 2017-03-06 | Disposition: A | Discharge status: Home / Self Care | Payer: Medicaid Other | Source: Ambulatory Visit | Attending: Obstetrics and Gynecology | Admitting: Obstetrics and Gynecology

## 2017-03-05 ENCOUNTER — Encounter (HOSPITAL_COMMUNITY): Payer: Self-pay | Admitting: *Deleted

## 2017-03-05 DIAGNOSIS — R252 Cramp and spasm: Secondary | ICD-10-CM

## 2017-03-05 DIAGNOSIS — N939 Abnormal uterine and vaginal bleeding, unspecified: Secondary | ICD-10-CM

## 2017-03-05 DIAGNOSIS — R1030 Lower abdominal pain, unspecified: Secondary | ICD-10-CM | POA: Insufficient documentation

## 2017-03-05 DIAGNOSIS — O9089 Other complications of the puerperium, not elsewhere classified: Secondary | ICD-10-CM

## 2017-03-05 DIAGNOSIS — Z8619 Personal history of other infectious and parasitic diseases: Secondary | ICD-10-CM | POA: Insufficient documentation
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
## 2017-03-05 NOTE — Discharge Instructions (Signed)
Postpartum Care After Vaginal Delivery °The period of time right after you deliver your newborn is called the postpartum period. °What kind of medical care will I receive? °· You may continue to receive fluids and medicines through an IV tube inserted into one of your veins. °· If an incision was made near your vagina (episiotomy) or if you had some vaginal tearing during delivery, cold compresses may be placed on your episiotomy or your tear. This helps to reduce pain and swelling. °· You may be given a squirt bottle to use when you go to the bathroom. You may use this until you are comfortable wiping as usual. To use the squirt bottle, follow these steps: °? Before you urinate, fill the squirt bottle with warm water. Do not use hot water. °? After you urinate, while you are sitting on the toilet, use the squirt bottle to rinse the area around your urethra and vaginal opening. This rinses away any urine and blood. °? You may do this instead of wiping. As you start healing, you may use the squirt bottle before wiping yourself. Make sure to wipe gently. °? Fill the squirt bottle with clean water every time you use the bathroom. °· You will be given sanitary pads to wear. °How can I expect to feel? °· You may not feel the need to urinate for several hours after delivery. °· You will have some soreness and pain in your abdomen and vagina. °· If you are breastfeeding, you may have uterine contractions every time you breastfeed for up to several weeks postpartum. Uterine contractions help your uterus return to its normal size. °· It is normal to have vaginal bleeding (lochia) after delivery. The amount and appearance of lochia is often similar to a menstrual period in the first week after delivery. It will gradually decrease over the next few weeks to a dry, yellow-brown discharge. For most women, lochia stops completely by 6-8 weeks after delivery. Vaginal bleeding can vary from woman to woman. °· Within the first few  days after delivery, you may have breast engorgement. This is when your breasts feel heavy, full, and uncomfortable. Your breasts may also throb and feel hard, tightly stretched, warm, and tender. After this occurs, you may have milk leaking from your breasts. Your health care provider can help you relieve discomfort due to breast engorgement. Breast engorgement should go away within a few days. °· You may feel more sad or worried than normal due to hormonal changes after delivery. These feelings should not last more than a few days. If these feelings do not go away after several days, speak with your health care provider. °How should I care for myself? °· Tell your health care provider if you have pain or discomfort. °· Drink enough water to keep your urine clear or pale yellow. °· Wash your hands thoroughly with soap and water for at least 20 seconds after changing your sanitary pads, after using the toilet, and before holding or feeding your baby. °· If you are not breastfeeding, avoid touching your breasts a lot. Doing this can make your breasts produce more milk. °· If you become weak or lightheaded, or you feel like you might faint, ask for help before: °? Getting out of bed. °? Showering. °· Change your sanitary pads frequently. Watch for any changes in your flow, such as a sudden increase in volume, a change in color, the passing of large blood clots. If you pass a blood clot from your vagina, save it   to show to your health care provider. Do not flush blood clots down the toilet without having your health care provider look at them. °· Make sure that all your vaccinations are up to date. This can help protect you and your baby from getting certain diseases. You may need to have immunizations done before you leave the hospital. °· If desired, talk with your health care provider about methods of family planning or birth control (contraception). °How can I start bonding with my baby? °Spending as much time as  possible with your baby is very important. During this time, you and your baby can get to know each other and develop a bond. Having your baby stay with you in your room (rooming in) can give you time to get to know your baby. Rooming in can also help you become comfortable caring for your baby. Breastfeeding can also help you bond with your baby. °How can I plan for returning home with my baby? °· Make sure that you have a car seat installed in your vehicle. °? Your car seat should be checked by a certified car seat installer to make sure that it is installed safely. °? Make sure that your baby fits into the car seat safely. °· Ask your health care provider any questions you have about caring for yourself or your baby. Make sure that you are able to contact your health care provider with any questions after leaving the hospital. °This information is not intended to replace advice given to you by your health care provider. Make sure you discuss any questions you have with your health care provider. °Document Released: 12/25/2006 Document Revised: 08/02/2015 Document Reviewed: 02/01/2015 °Elsevier Interactive Patient Education © 2018 [employer] ° °

## 2017-03-22 ENCOUNTER — Ambulatory Visit (INDEPENDENT_AMBULATORY_CARE_PROVIDER_SITE_OTHER): Payer: Medicaid Other | Admitting: Student

## 2017-03-22 ENCOUNTER — Ambulatory Visit: Payer: Self-pay | Admitting: Clinical

## 2017-03-22 ENCOUNTER — Encounter: Payer: Self-pay | Admitting: Student

## 2017-03-22 NOTE — BH Specialist Note (Deleted)
Pt declined visit today  [facility] 03/22/2017 04/18/2016 12/15/2015 12/06/2015  Decreased Interest 0 2 1 1   Down, Depressed, Hopeless 0 2 1 1   PHQ - 2 Score 0 4 2 2   Altered sleeping 0 3 3 3   Tired, decreased energy 0 2 3 2   Change in appetite 0 3 2 2   Feeling bad or failure about yourself  0 3 2 2   Trouble concentrating 0 1 2 0  Moving slowly or fidgety/restless 0 1 1 1   Suicidal thoughts 0 0 0 0  PHQ-9 Score 0 17 15 12    GAD 7 : Generalized Anxiety Score 03/22/2017 04/18/2016 12/15/2015 12/06/2015  Nervous, Anxious, on Edge 0 1 1 1   Control/stop worrying 0 3 2 1   Worry too much - different things 0 3 2 1   Trouble relaxing 0 3 2 2   Restless 0 2 2 1   Easily annoyed or irritable 0 3 3 3   Afraid - awful might happen 0 3 2 2   Total GAD 7 Score 0 18 14 11

## 2017-03-22 NOTE — Patient Instructions (Signed)

## 2017-03-22 NOTE — Progress Notes (Addendum)
Subjective:     Sara Lewis is a 22 y.o. female who presents for a postpartum visit. She is 4 weeks postpartum following a spontaneous vaginal delivery. I have fully reviewed the prenatal and intrapartum course. The delivery was at 39 gestational weeks. Outcome: spontaneous vaginal delivery. Anesthesia: none. Postpartum course has been uneventful. Baby's course has been uneventful. Baby is feeding by breast. Bleeding no bleeding. Bowel function is normal. Bladder function is normal. Patient is not sexually active. Contraception method is Depo-Provera injections. Postpartum depression screening: negative.  The following portions of the patient's history were reviewed and updated as appropriate: allergies, current medications, past family history, past medical history, past social history, past surgical history and problem list.  Patient states that she is feeling very good. She does not think she needs to see Jamie-her PP depression was mostly related to her family situation with her son but this pregnancy it has been much better.   Review of Systems Pertinent items are noted in HPI.   Objective:    LMP 06/12/2016 (Approximate)   General:  alert, cooperative and no distress   Breasts:  inspection negative, no nipple discharge or bleeding, no masses or nodularity palpable  Lungs: clear to auscultation bilaterally  Heart:  regular rate and rhythm, S1, S2 normal, no murmur, click, rub or gallop  Abdomen: soft, non-tender; bowel sounds normal; no masses,  no organomegaly   Vulva:  not evaluated  Vagina: not evaluated  Cervix:  not evaluated  Corpus: not examined  Adnexa:  not evaluated  Rectal Exam: Not performed.        Assessment:    Healthy  postpartum exam. Pap smear not done at today's visit.   PP Depression score negative.   Plan:    1. Contraception: Depo-Provera injections. Plan to come back in May 24 2017 for Depo shot.  2. Follow up in: 1 year or as needed. Plan for  pap smear once patient turns 22.

## 2017-03-23 NOTE — BH Specialist Note (Deleted)
Pt declined visit.

## 2017-03-29 NOTE — Progress Notes (Signed)
Error

## 2017-06-07 ENCOUNTER — Ambulatory Visit: Payer: Self-pay

## 2018-01-07 ENCOUNTER — Encounter: Payer: Self-pay | Admitting: *Deleted

## 2018-05-24 IMAGING — CT CT ANGIO CHEST
2 of 6 series · 18 of 36 positions shown · IV contrast (Omni 300)
Comparison: None.

CLINICAL DATA: Chest pain on breathing started 4 hours ago.
Tachycardia and shortness of breath.

EXAM:
CT ANGIOGRAPHY CHEST WITH CONTRAST
TECHNIQUE: Multidetector CT imaging of the chest was performed using the
standard protocol during bolus administration of intravenous
contrast. Multiplanar CT image reconstructions and MIPs were
obtained to evaluate the vascular anatomy.
CONTRAST:  100 mL Isovue 370

[Series 6: pe thins · axial · 0.56mm/px · z∈[+1062,+1261]mm · 17 of 225 slices shown]
[im 13/225  lung]
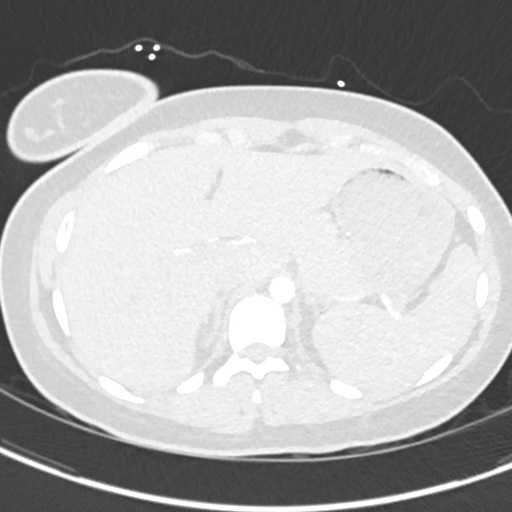
[im 25/225  mediastinal]
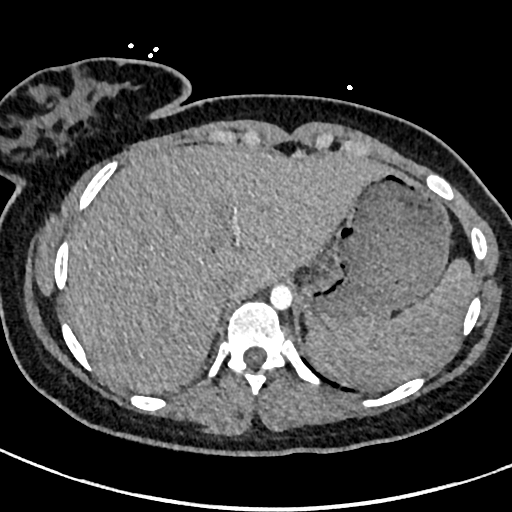
[im 38/225  lung]
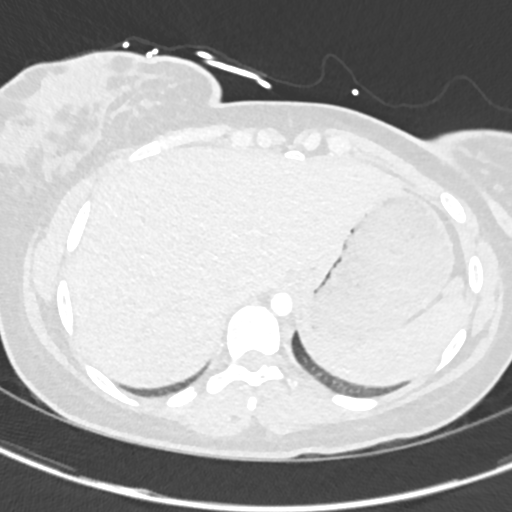
[im 50/225  mediastinal]
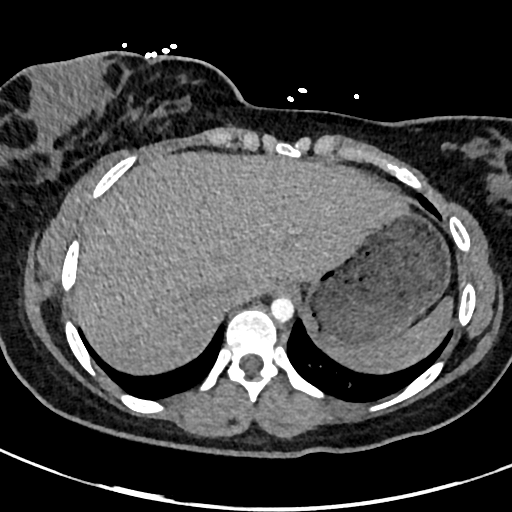
[im 63/225  lung]
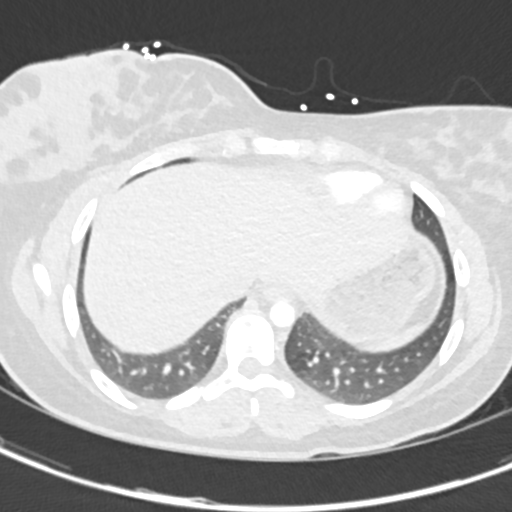
[im 75/225  mediastinal]
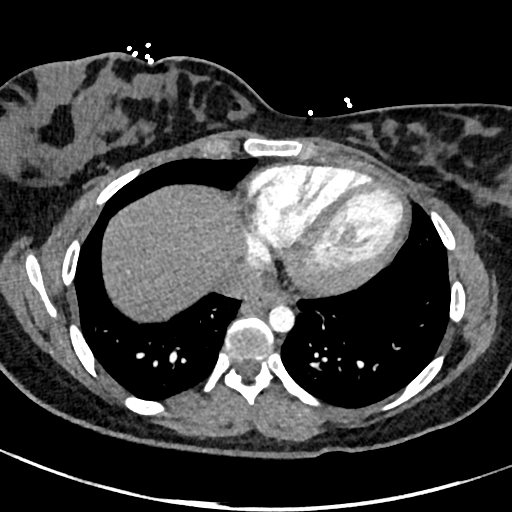
[im 88/225  lung]
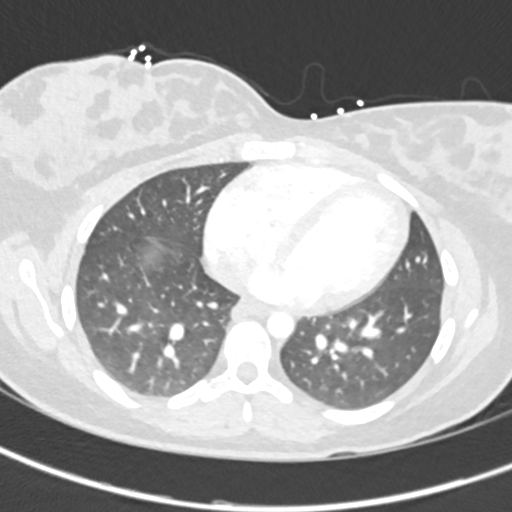
[im 100/225  mediastinal]
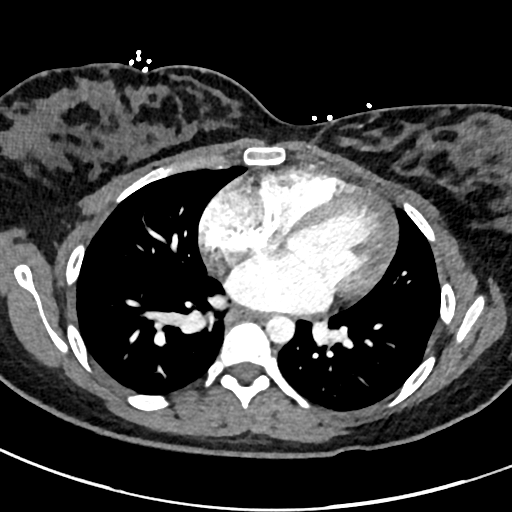
[im 113/225  lung]
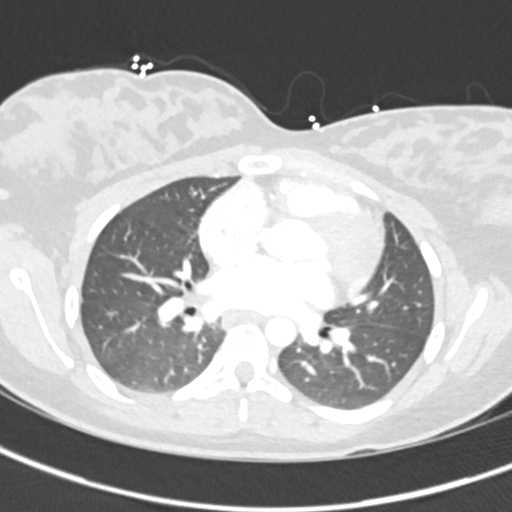
[im 125/225  mediastinal]
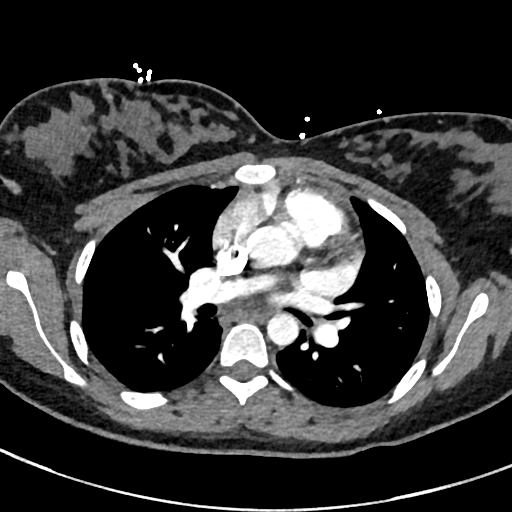
[im 137/225  lung]
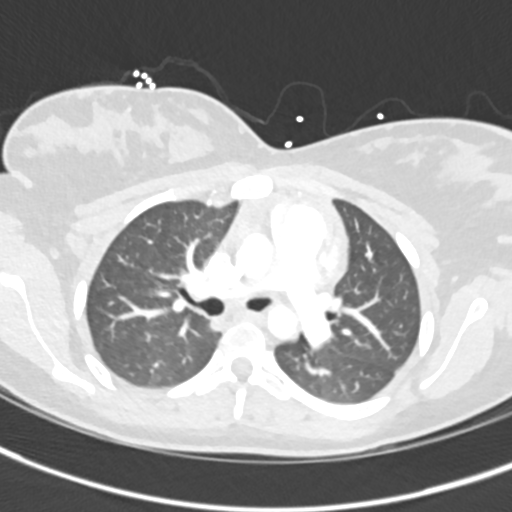
[im 150/225  mediastinal]
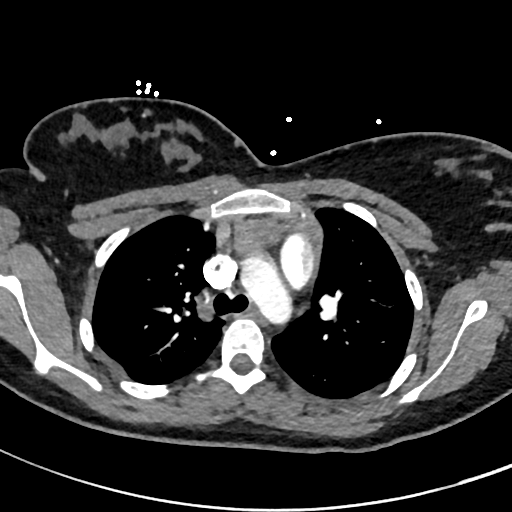
[im 162/225  lung]
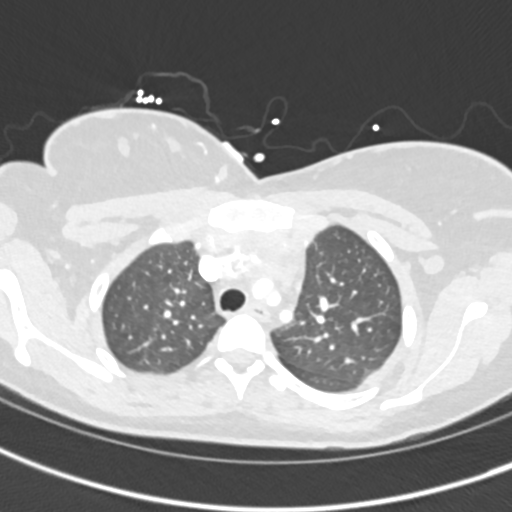
[im 175/225  mediastinal]
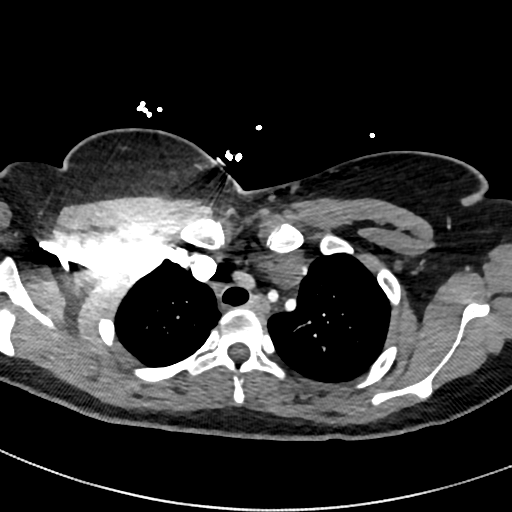
[im 187/225  lung]
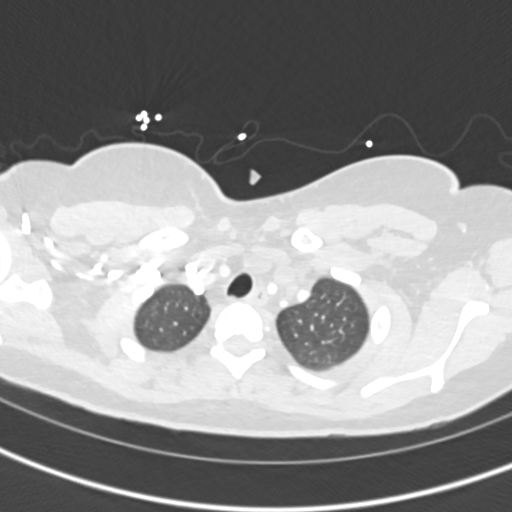
[im 200/225  mediastinal]
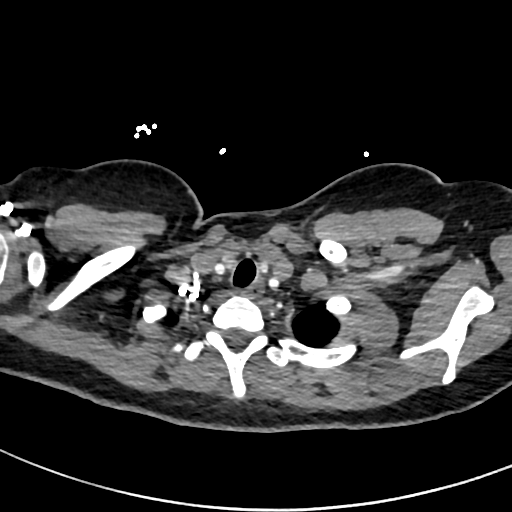
[im 212/225  lung]
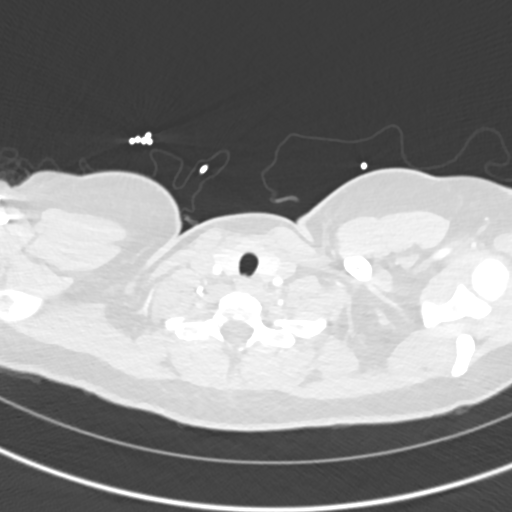

[Series 7: pe 2mm cor · coronal · 0.47mm/px · 1 of 86 slices shown]
[im 43/86  mediastinal]
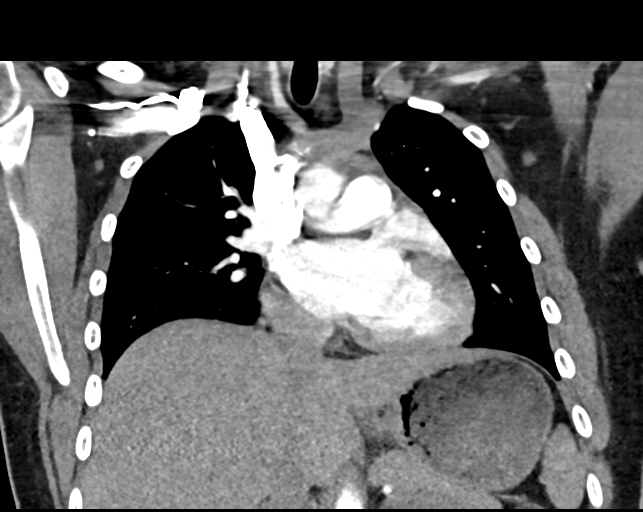

[18 of 36 positions shown; findings below may reference images not displayed]

FINDINGS: Cardiovascular: Good opacification of the central and segmental
pulmonary arteries. No focal filling defects. No evidence of
significant pulmonary embolus. Normal heart size. No pericardial
effusion. Normal caliber thoracic aorta. No aortic dissection. Great
vessel origins are patent.

Mediastinum/Nodes: No enlarged mediastinal, hilar, or axillary lymph
nodes. Thyroid gland, trachea, and esophagus demonstrate no
significant findings.

Lungs/Pleura: Lungs are clear. No pleural effusion or pneumothorax.

Upper Abdomen: No acute abnormality.

Musculoskeletal: No chest wall abnormality. No acute or significant
osseous findings.

Review of the MIP images confirms the above findings.
IMPRESSION: No evidence of significant pulmonary embolus. No evidence of active
pulmonary disease.

## 2018-07-11 ENCOUNTER — Encounter: Payer: Self-pay | Admitting: *Deleted
